# Patient Record
Sex: Female | Born: 1960 | Race: White | Hispanic: No | Marital: Single
Health system: Midwestern US, Community
[De-identification: ages and names within clinical notes are randomized; demographics above are authoritative.]

## PROBLEM LIST (undated history)

## (undated) DIAGNOSIS — C50911 Malignant neoplasm of unspecified site of right female breast: Secondary | ICD-10-CM

---

## 2013-12-05 ENCOUNTER — Ambulatory Visit: Admit: 2013-12-05 | Payer: BLUE CROSS/BLUE SHIELD | Attending: Surgery | Primary: Family Medicine

## 2013-12-05 DIAGNOSIS — C50911 Malignant neoplasm of unspecified site of right female breast: Secondary | ICD-10-CM

## 2013-12-06 NOTE — Progress Notes (Signed)
Dr. Ilene Qua, MD, FACS  [address]  [place] [address]      Patient: Candice Carter     MRN: [id number]     DOB: Jul 22, 1960     Visit Type: Follow up    Date: 12/06/2013      Time: 12:40 PM     Referring Practitioner: Clent Jacks, MD     Primary Care Provider: Phys Other, MD     Chief Complaint  Chief Complaint   Patient presents with   ??? Breast Cancer     right        History Of Present Illness:   Pt had a routine mammogram in August which showed a new nodule in the right breast. She had additional views and and ultrasound which showed a 7 mm suspicious mass in the right. She denies feeling any breast masses, skin changes or nipple discharge. She had an ultrasound guided core biopsy 9/4 which showed an invasive ductal carcinoma which is ER, PR + and HER 2 negative. She had an MRI 9/30 which did not show any bilateral or multicentric disease. She is very anxious about having another cancer and wants to have both of her breasts removed.  She came up here from [facility] to meet with Dr. Ted Mcalpine to discuss bilateral mastectomies with DIEP reconstruction. She saw a Dietitian in [facility] and had testing done but is waiting for the results.    Past Medical History  Past Medical History   Diagnosis Date   ??? Anemia    ??? Fibroids    ??? Colon cancer ([place]) 1991     had surgery and chemotherapy   ??? Hypertension         Past Surgical History  Past Surgical History   Procedure Laterality Date   ??? Hx laparotomy       lysis of adhesions   ??? Hx breast biopsy  2005     benign   ??? Hx dilation and curettage     ??? Hx heent  2011     jaw surgery   ??? Hx rhinoplasty     ??? Hx colectomy  1991     for cancer;had chemotherapy   ??? Hx hernia repair  2010     incisional hernia repair   ??? Hx gi  2011     lysis of adhesions        Family History  Family History   Problem Relation Age of Onset   ??? Breast Cancer Neg Hx    ??? Ovarian Cancer Neg Hx         Current Meds  Current Outpatient Prescriptions   Medication Sig Dispense Refill    ??? Hydrochlorothiazide (HYDRODIURIL) 12.5 mg tablet Take 12.5 mg by mouth daily.     ??? metoprolol (LOPRESSOR) 50 mg tablet Take  by mouth two (2) times a day.     ??? ibuprofen (ADVIL) 200 mg tablet Take  by mouth.          Social History  History     Social History   ??? Marital Status: SINGLE     Spouse Name: N/A     Number of Children: N/A   ??? Years of Education: N/A     Social History Main Topics   ??? Smoking status: Never Smoker    ??? Smokeless tobacco: Not on file   ??? Alcohol Use: Yes      Comment: socially   ??? Drug Use:  Not on file   ??? Sexual Activity: Not on file     Other Topics Concern   ??? Not on file     Social History Narrative       Patient Occupation  attorney    Tobacco  History   Smoking status   ??? Never Smoker    Smokeless tobacco   ??? Not on file       Alcohol  History   Alcohol Use   ??? Yes     Comment: socially          Allergies  Allergies   Allergen Reactions   ??? Levaquin [Levofloxacin] Hives     Breathing problem   ??? Premarin [Conjugated Estrogens] Hives     Breathing problem, vaginal bleeding       LMP  No LMP recorded. Patient is not currently having periods (Reason: Menopause).       Reproductive     Menarche: 28    Menopause: perimenopausal    Number of  Full Term Pregnancies: 0    Age at First Full Term Pregnancy: NA    Breastfeeding: NA    OCP Hx: took for 10 years to regulate her menses    HRT Hx: no    Ethnicity        Generation  1st generation WE American    Review of Systems:  Constitutional: has hot flashes  Ears, nose, mouth, throat, and face: wears glasses  Respiratory: chronic cough  Cardiovascular: negative  Gastrointestinal: negative  Genitourinary:positive for frequency  Integument/breast: negative  Hematologic/lymphatic: negative  Musculoskeletal:negative  Neurological: negative  Allergic/Immunologic: negative    Vitals:   Filed Vitals:    12/05/13 1625   BP: 156/86   Pulse: 59   Height: 5' 6" (1.676 m)   Weight: 213 lb (96.616 kg)         BMI:    Body mass index is 34.4 kg/(m^2).      Physical Examination  Pt is awake and alert, somewhat anxious, sitting on the exam table.    Breast  She has a well healed scar at the lateral aspect of the right areola and at the lower, inner left breast. She has large, pendulous breasts. She has no discreet masses, skin changes or nipple discharge.      Assessment/Plan  I met with the patient and her brother to discuss her treatment options. We discussed surgical options of a lumpectomy with radiation vs a mastectomy +/- reconstruction.  I explained that there is no difference in survival between these two options. The patient, however, is very nervous about getting radiation.  I explained to her that this was not chemotherapy and has minimal side effects. She understood this but since her colon cancer she feels that she wants to have both breasts removed. I discussed the technique of a sentinel node biopsy and possible axillary node dissection. She had already met with Drs. Usal and Clovis Riley. She would like nipple sparing mastectomies but is currently not a candidate because she has such large pendulous breasts.  Drs. Usal and Clovis Riley came up with a 2 stage procedure which would possible allow her to keep her nipples. The first would be a combination lumpectomy and breast reduction, with a sentinel node biopsy. This would remove the cancer, stage her nodes and be like a delayed flap for her nipples, increasing the blood flow to them in the interim while she was healing.  This would also give her time to get  the results from her genetic testing and to determine if she needs chemotherapy. We briefly discussed indications for chemotherapy including positive nodes or a high recurrence score on an oncotype dx. I gave her extensive literature about breast cancer types, surgical options, radiation and chemotherapy.  I think she wants to proceed with the lumpectomy and reduction.  I told her since the mass is not palpable, she  would need preoperative needle localization on the day of surgery. She will need medical clearance as well.  I will discuss the timing of her case with Drs. Usal and Clovis Riley.  Signature: Christia Reading, MD

## 2013-12-11 ENCOUNTER — Encounter

## 2013-12-17 ENCOUNTER — Inpatient Hospital Stay: Admit: 2013-12-17 | Payer: BLUE CROSS/BLUE SHIELD | Primary: Family Medicine

## 2013-12-17 DIAGNOSIS — C50111 Malignant neoplasm of central portion of right female breast: Secondary | ICD-10-CM

## 2013-12-18 NOTE — Other (Signed)
Patient states she was not given CHG wipes to use prior to surgery.

## 2013-12-19 ENCOUNTER — Ambulatory Visit: Admit: 2013-12-19 | Payer: BLUE CROSS/BLUE SHIELD | Primary: Family Medicine

## 2013-12-19 ENCOUNTER — Inpatient Hospital Stay
Admit: 2013-12-19 | Discharge: 2013-12-20 | Disposition: A | Discharge status: Home / Self Care | Payer: BLUE CROSS/BLUE SHIELD | Attending: Plastic Surgery | Admitting: Plastic Surgery

## 2013-12-19 DIAGNOSIS — C50911 Malignant neoplasm of unspecified site of right female breast: Secondary | ICD-10-CM

## 2013-12-19 LAB — HCG URINE, QL. - POC
Pregnancy test,QC: NEGATIVE
Pregnancy test,urine (POC): NEGATIVE

## 2013-12-19 MED ORDER — ONDANSETRON (PF) 4 MG/2 ML INJECTION
4 mg/2 mL | INTRAMUSCULAR | Status: AC
Start: 2013-12-19 — End: ?

## 2013-12-19 MED ORDER — LACTATED RINGERS IV
INTRAVENOUS | Status: DC | PRN
Start: 2013-12-19 — End: 2013-12-19
  Administered 2013-12-19 (×3): via INTRAVENOUS

## 2013-12-19 MED ORDER — CEFAZOLIN 2 G IN 100 ML 0.9% NS
2 gram/100 mL | INTRAVENOUS | Status: AC
Start: 2013-12-19 — End: ?

## 2013-12-19 MED ORDER — METHYLENE BLUE (ANTIDOTE) 1 % IJ SOLN
1 % (0 mg/mL) | INTRAVENOUS | Status: DC | PRN
Start: 2013-12-19 — End: 2013-12-19
  Administered 2013-12-19: 15:00:00 via TOPICAL

## 2013-12-19 MED ORDER — MIDAZOLAM 1 MG/ML IJ SOLN
1 mg/mL | INTRAMUSCULAR | Status: DC | PRN
Start: 2013-12-19 — End: 2013-12-19
  Administered 2013-12-19 (×2): via INTRAVENOUS

## 2013-12-19 MED ORDER — TRIMETHOBENZAMIDE 100 MG/ML IM
100 mg/mL | INTRAMUSCULAR | Status: AC
Start: 2013-12-19 — End: 2013-12-19
  Administered 2013-12-19: 19:00:00 via INTRAMUSCULAR

## 2013-12-19 MED ORDER — FENTANYL CITRATE (PF) 50 MCG/ML IJ SOLN
50 mcg/mL | INTRAMUSCULAR | Status: AC
Start: 2013-12-19 — End: ?

## 2013-12-19 MED ORDER — TRIMETHOBENZAMIDE 100 MG/ML IM
100 mg/mL | Freq: Once | INTRAMUSCULAR | Status: AC
Start: 2013-12-19 — End: 2013-12-19

## 2013-12-19 MED ORDER — BUPIVACAINE LIPOSOME (PF) 266 MG/20 ML (13.3 MG/ML) SUSP, INFILTRATION
1.3 % (13.3 mg/mL) | Status: AC
Start: 2013-12-19 — End: ?

## 2013-12-19 MED ORDER — PROPOFOL 10 MG/ML IV EMUL
10 mg/mL | INTRAVENOUS | Status: AC
Start: 2013-12-19 — End: ?

## 2013-12-19 MED ORDER — HYDROMORPHONE (PF) 2 MG/ML IJ SOLN
2 mg/mL | Freq: Once | INTRAMUSCULAR | Status: DC | PRN
Start: 2013-12-19 — End: 2013-12-19

## 2013-12-19 MED ORDER — KETOROLAC TROMETHAMINE 15 MG/ML INJECTION
15 mg/mL | Freq: Three times a day (TID) | INTRAMUSCULAR | Status: DC | PRN
Start: 2013-12-19 — End: 2013-12-20
  Administered 2013-12-20 (×2): via INTRAVENOUS

## 2013-12-19 MED ORDER — LACTATED RINGERS IV
INTRAVENOUS | Status: DC
Start: 2013-12-19 — End: 2013-12-19

## 2013-12-19 MED ORDER — SODIUM CHLORIDE 0.9 % IJ SYRG
INTRAMUSCULAR | Status: DC | PRN
Start: 2013-12-19 — End: 2013-12-19
  Administered 2013-12-19: 19:00:00 via INTRAVENOUS

## 2013-12-19 MED ORDER — ACETAMINOPHEN 1,000 MG/100 ML (10 MG/ML) IV
1000 mg/100 mL (10 mg/mL) | INTRAVENOUS | Status: DC | PRN
Start: 2013-12-19 — End: 2013-12-19
  Administered 2013-12-19: 15:00:00 via INTRAVENOUS

## 2013-12-19 MED ORDER — OXYCODONE-ACETAMINOPHEN 5 MG-325 MG TAB
5-325 mg | ORAL | Status: DC | PRN
Start: 2013-12-19 — End: 2013-12-20

## 2013-12-19 MED ORDER — PROPOFOL 10 MG/ML IV EMUL
10 mg/mL | INTRAVENOUS | Status: DC | PRN
Start: 2013-12-19 — End: 2013-12-19
  Administered 2013-12-19: 14:00:00 via INTRAVENOUS

## 2013-12-19 MED ORDER — LACTATED RINGERS BOLUS IV
Freq: Two times a day (BID) | INTRAVENOUS | Status: DC | PRN
Start: 2013-12-19 — End: 2013-12-19

## 2013-12-19 MED ORDER — DEXAMETHASONE SODIUM PHOSPHATE 4 MG/ML IJ SOLN
4 mg/mL | INTRAMUSCULAR | Status: AC
Start: 2013-12-19 — End: ?

## 2013-12-19 MED ORDER — CEFAZOLIN 1 GRAM SOLUTION FOR INJECTION
1 gram | Freq: Once | INTRAMUSCULAR | Status: AC
Start: 2013-12-19 — End: 2013-12-19
  Administered 2013-12-19 (×2): via INTRAVENOUS

## 2013-12-19 MED ORDER — SODIUM CHLORIDE 0.9 % IRRIGATION SOLN
0.9 % | Status: DC | PRN
Start: 2013-12-19 — End: 2013-12-19
  Administered 2013-12-19: 16:00:00

## 2013-12-19 MED ORDER — EPHEDRINE SULFATE 50 MG/ML IJ SOLN
50 mg/mL | INTRAMUSCULAR | Status: DC | PRN
Start: 2013-12-19 — End: 2013-12-19
  Administered 2013-12-19 (×4): via INTRAVENOUS

## 2013-12-19 MED ORDER — LIDOCAINE HCL 2 % (20 MG/ML) IJ SOLN
20 mg/mL (2 %) | Freq: Once | INTRAMUSCULAR | Status: AC
Start: 2013-12-19 — End: 2013-12-19
  Administered 2013-12-19: 13:00:00 via SUBCUTANEOUS

## 2013-12-19 MED ORDER — SUCCINYLCHOLINE CHLORIDE 20 MG/ML INJECTION
20 mg/mL | INTRAMUSCULAR | Status: DC | PRN
Start: 2013-12-19 — End: 2013-12-19
  Administered 2013-12-19: 14:00:00 via INTRAVENOUS

## 2013-12-19 MED ORDER — ISOSULFAN BLUE 1 % IJ SOLN
1 % | SUBCUTANEOUS | Status: DC | PRN
Start: 2013-12-19 — End: 2013-12-19
  Administered 2013-12-19: 14:00:00 via SUBCUTANEOUS

## 2013-12-19 MED ORDER — NEOSTIGMINE METHYLSULFATE 0.5 MG/ML INJECTION
0.5 mg/mL | INTRAMUSCULAR | Status: DC | PRN
Start: 2013-12-19 — End: 2013-12-19
  Administered 2013-12-19: 18:00:00 via INTRAVENOUS

## 2013-12-19 MED ORDER — FENTANYL CITRATE (PF) 50 MCG/ML IJ SOLN
50 mcg/mL | INTRAMUSCULAR | Status: DC | PRN
Start: 2013-12-19 — End: 2013-12-19
  Administered 2013-12-19: 21:00:00 via INTRAVENOUS

## 2013-12-19 MED ORDER — SODIUM CHLORIDE 0.9 % IV
5 mg/mL | Freq: Three times a day (TID) | INTRAVENOUS | Status: DC | PRN
Start: 2013-12-19 — End: 2013-12-19

## 2013-12-19 MED ORDER — ALBUTEROL SULFATE 0.083 % (0.83 MG/ML) SOLN FOR INHALATION
2.5 mg /3 mL (0.083 %) | RESPIRATORY_TRACT | Status: DC | PRN
Start: 2013-12-19 — End: 2013-12-19

## 2013-12-19 MED ORDER — ROCURONIUM 10 MG/ML IV
10 mg/mL | INTRAVENOUS | Status: DC | PRN
Start: 2013-12-19 — End: 2013-12-19
  Administered 2013-12-19 (×4): via INTRAVENOUS

## 2013-12-19 MED ORDER — SODIUM CHLORIDE 0.9 % IV PIGGY BACK
1 gram | Freq: Three times a day (TID) | INTRAVENOUS | Status: DC
Start: 2013-12-19 — End: 2013-12-20
  Administered 2013-12-20 (×3): via INTRAVENOUS

## 2013-12-19 MED ORDER — LACTATED RINGERS IV
INTRAVENOUS | Status: DC
Start: 2013-12-19 — End: 2013-12-20
  Administered 2013-12-19 – 2013-12-20 (×3): via INTRAVENOUS

## 2013-12-19 MED ORDER — MEPERIDINE (PF) 25 MG/ML INJ SOLUTION
25 mg/ml | INTRAMUSCULAR | Status: DC | PRN
Start: 2013-12-19 — End: 2013-12-19

## 2013-12-19 MED ORDER — NEOSTIGMINE METHYLSULFATE 1 MG/ML INJECTION
1 mg/mL | INTRAMUSCULAR | Status: AC
Start: 2013-12-19 — End: ?

## 2013-12-19 MED ORDER — GLYCOPYRROLATE 0.2 MG/ML IJ SOLN
0.2 mg/mL | INTRAMUSCULAR | Status: AC
Start: 2013-12-19 — End: ?

## 2013-12-19 MED ORDER — METOCLOPRAMIDE 5 MG/ML IJ SOLN
5 mg/mL | Freq: Three times a day (TID) | INTRAMUSCULAR | Status: DC | PRN
Start: 2013-12-19 — End: 2013-12-20
  Administered 2013-12-19: 22:00:00 via INTRAVENOUS

## 2013-12-19 MED ORDER — LIDOCAINE HCL 2 % (20 MG/ML) IJ SOLN
20 mg/mL (2 %) | INTRAMUSCULAR | Status: AC
Start: 2013-12-19 — End: ?

## 2013-12-19 MED ORDER — RACEPINEPHRINE 2.25 % NEB SOLUTION
2.25 % | RESPIRATORY_TRACT | Status: DC | PRN
Start: 2013-12-19 — End: 2013-12-19

## 2013-12-19 MED ORDER — ISOSULFAN BLUE 1 % IJ SOLN
1 % | SUBCUTANEOUS | Status: AC
Start: 2013-12-19 — End: ?

## 2013-12-19 MED ORDER — ACETAMINOPHEN 1,000 MG/100 ML (10 MG/ML) IV
1000 mg/100 mL (10 mg/mL) | Freq: Three times a day (TID) | INTRAVENOUS | Status: AC | PRN
Start: 2013-12-19 — End: 2013-12-20
  Administered 2013-12-19 – 2013-12-20 (×2): via INTRAVENOUS

## 2013-12-19 MED ORDER — CEFAZOLIN 1 GRAM SOLUTION FOR INJECTION
1 gram | INTRAMUSCULAR | Status: AC
Start: 2013-12-19 — End: ?

## 2013-12-19 MED ORDER — SUCCINYLCHOLINE CHLORIDE 20 MG/ML INJECTION
20 mg/mL | INTRAMUSCULAR | Status: AC
Start: 2013-12-19 — End: ?

## 2013-12-19 MED ORDER — DEXAMETHASONE SODIUM PHOSPHATE 4 MG/ML IJ SOLN
4 mg/mL | INTRAMUSCULAR | Status: DC | PRN
Start: 2013-12-19 — End: 2013-12-19
  Administered 2013-12-19: 14:00:00 via INTRAVENOUS

## 2013-12-19 MED ORDER — ROCURONIUM 10 MG/ML IV
10 mg/mL | INTRAVENOUS | Status: AC
Start: 2013-12-19 — End: ?

## 2013-12-19 MED ORDER — ACETAMINOPHEN 1,000 MG/100 ML (10 MG/ML) IV
1000 mg/100 mL (10 mg/mL) | INTRAVENOUS | Status: AC
Start: 2013-12-19 — End: ?

## 2013-12-19 MED ORDER — HEPARIN (PORCINE) 5,000 UNIT/ML IJ SOLN
5000 unit/mL | INTRAMUSCULAR | Status: AC
Start: 2013-12-19 — End: ?

## 2013-12-19 MED ORDER — KETOROLAC TROMETHAMINE 30 MG/ML INJECTION
30 mg/mL (1 mL) | INTRAMUSCULAR | Status: DC
Start: 2013-12-19 — End: 2013-12-19

## 2013-12-19 MED ORDER — CEFAZOLIN 2 G IN 100 ML 0.9% NS
2 gram/100 mL | Freq: Once | INTRAVENOUS | Status: DC
Start: 2013-12-19 — End: 2013-12-19

## 2013-12-19 MED ORDER — GLYCOPYRROLATE 0.2 MG/ML IJ SOLN
0.2 mg/mL | INTRAMUSCULAR | Status: DC | PRN
Start: 2013-12-19 — End: 2013-12-19
  Administered 2013-12-19: 18:00:00 via INTRAVENOUS

## 2013-12-19 MED ORDER — LIDOCAINE (PF) 20 MG/ML (2 %) IJ SOLN
20 mg/mL (2 %) | INTRAMUSCULAR | Status: DC | PRN
Start: 2013-12-19 — End: 2013-12-19
  Administered 2013-12-19: 14:00:00 via INTRAVENOUS

## 2013-12-19 MED ORDER — SODIUM BICARBONATE 4 % IV
4 % | Freq: Once | INTRAVENOUS | Status: AC
Start: 2013-12-19 — End: 2013-12-19
  Administered 2013-12-19: 13:00:00 via SUBCUTANEOUS

## 2013-12-19 MED ORDER — MIDAZOLAM 1 MG/ML IJ SOLN
1 mg/mL | INTRAMUSCULAR | Status: AC
Start: 2013-12-19 — End: ?

## 2013-12-19 MED ORDER — BACITRACIN 50,000 UNIT IM
50000 unit | INTRAMUSCULAR | Status: AC
Start: 2013-12-19 — End: ?

## 2013-12-19 MED ORDER — FENTANYL CITRATE (PF) 50 MCG/ML IJ SOLN
50 mcg/mL | INTRAMUSCULAR | Status: DC | PRN
Start: 2013-12-19 — End: 2013-12-19
  Administered 2013-12-19 (×10): via INTRAVENOUS

## 2013-12-19 MED ORDER — SODIUM CHLORIDE 0.9 % IJ SYRG
Freq: Three times a day (TID) | INTRAMUSCULAR | Status: DC
Start: 2013-12-19 — End: 2013-12-19

## 2013-12-19 MED ORDER — HEPARIN (PORCINE) 5,000 UNIT/ML IJ SOLN
5000 unit/mL | Freq: Three times a day (TID) | INTRAMUSCULAR | Status: DC
Start: 2013-12-19 — End: 2013-12-20
  Administered 2013-12-20 (×3): via SUBCUTANEOUS

## 2013-12-19 MED ORDER — SODIUM CHLORIDE 0.9 % IV
INTRAVENOUS | Status: DC | PRN
Start: 2013-12-19 — End: 2013-12-19
  Administered 2013-12-19 (×2): via INTRAVENOUS

## 2013-12-19 MED ORDER — METHYLENE BLUE (ANTIDOTE) 1 % IJ SOLN
1 % (0 mg/mL) | INTRAVENOUS | Status: AC
Start: 2013-12-19 — End: ?

## 2013-12-19 MED FILL — TIGAN 100 MG/ML INTRAMUSCULAR SOLUTION: 100 mg/mL | INTRAMUSCULAR | Qty: 2

## 2013-12-19 MED FILL — LACTATED RINGERS IV: INTRAVENOUS | Qty: 1000

## 2013-12-19 MED FILL — EXPAREL (PF) 1.3 % (13.3 MG/ML) SUSPENSION FOR LOCAL INFILTRATION: 1.3 % (13.3 mg/mL) | Qty: 20

## 2013-12-19 MED FILL — METHYLENE BLUE (ANTIDOTE) 1 % IJ SOLN: 1 % (0 mg/mL) | INTRAVENOUS | Qty: 10

## 2013-12-19 MED FILL — ROCURONIUM 10 MG/ML IV: 10 mg/mL | INTRAVENOUS | Qty: 5

## 2013-12-19 MED FILL — BD POSIFLUSH NORMAL SALINE 0.9 % INJECTION SYRINGE: INTRAMUSCULAR | Qty: 10

## 2013-12-19 MED FILL — MIDAZOLAM 1 MG/ML IJ SOLN: 1 mg/mL | INTRAMUSCULAR | Qty: 2

## 2013-12-19 MED FILL — HEPARIN (PORCINE) 5,000 UNIT/ML IJ SOLN: 5000 unit/mL | INTRAMUSCULAR | Qty: 1

## 2013-12-19 MED FILL — BACITRACIN 50,000 UNIT IM: 50000 unit | INTRAMUSCULAR | Qty: 50000

## 2013-12-19 MED FILL — ONDANSETRON (PF) 4 MG/2 ML INJECTION: 4 mg/2 mL | INTRAMUSCULAR | Qty: 2

## 2013-12-19 MED FILL — METOCLOPRAMIDE 5 MG/ML IJ SOLN: 5 mg/mL | INTRAMUSCULAR | Qty: 2

## 2013-12-19 MED FILL — GLYCOPYRROLATE 0.2 MG/ML IJ SOLN: 0.2 mg/mL | INTRAMUSCULAR | Qty: 3

## 2013-12-19 MED FILL — FENTANYL CITRATE (PF) 50 MCG/ML IJ SOLN: 50 mcg/mL | INTRAMUSCULAR | Qty: 2

## 2013-12-19 MED FILL — DEXAMETHASONE SODIUM PHOSPHATE 4 MG/ML IJ SOLN: 4 mg/mL | INTRAMUSCULAR | Qty: 1

## 2013-12-19 MED FILL — CEFAZOLIN 1 GRAM SOLUTION FOR INJECTION: 1 gram | INTRAMUSCULAR | Qty: 2000

## 2013-12-19 MED FILL — CEFAZOLIN 2 G IN 100 ML 0.9% NS: 2 gram/100 mL | INTRAVENOUS | Qty: 100

## 2013-12-19 MED FILL — QUELICIN 20 MG/ML INJECTION SOLUTION: 20 mg/mL | INTRAMUSCULAR | Qty: 10

## 2013-12-19 MED FILL — OFIRMEV 1,000 MG/100 ML (10 MG/ML) INTRAVENOUS SOLUTION: 1000 mg/100 mL (10 mg/mL) | INTRAVENOUS | Qty: 100

## 2013-12-19 MED FILL — LIDOCAINE HCL 2 % (20 MG/ML) IJ SOLN: 20 mg/mL (2 %) | INTRAMUSCULAR | Qty: 20

## 2013-12-19 MED FILL — FENTANYL CITRATE (PF) 50 MCG/ML IJ SOLN: 50 mcg/mL | INTRAMUSCULAR | Qty: 5

## 2013-12-19 MED FILL — NEOSTIGMINE METHYLSULFATE 1 MG/ML INJECTION: 1 mg/mL | INTRAMUSCULAR | Qty: 10

## 2013-12-19 MED FILL — ISOSULFAN BLUE 1 % IJ SOLN: 1 % | SUBCUTANEOUS | Qty: 5

## 2013-12-19 MED FILL — LACTATED RINGERS IV: INTRAVENOUS | Qty: 500

## 2013-12-19 MED FILL — PROPOFOL 10 MG/ML IV EMUL: 10 mg/mL | INTRAVENOUS | Qty: 20

## 2013-12-19 NOTE — Interval H&P Note (Signed)
H&P Update:  Candice Carter was seen and examined.  History and physical has been reviewed. There have been no significant clinical changes since the completion of the originally dated History and Physical.  Patient identified by surgeon; surgical site was confirmed by patient and surgeon.    Signed By: Christia Reading, MD     December 19, 2013 9:18 AM

## 2013-12-19 NOTE — Op Note (Signed)
[facility]  OPERATIVE REPORT                                            Name: Candice Carter, Candice Carter                                            MR#: [id number]                                            Account #: [id number]                                            Date of Adm: 12/19/2013        Facility [place] [id number]  ChartScript-[id number]    DATE OF SURGERY: 12/19/2013    SURGEONS:  1. Tilden Dome, MD.  2. Clent Jacks, MD.  3. Orlin Hilding, MD.    PREOPERATIVE DIAGNOSIS: Right breast cancer.    POSTOPERATIVE DIAGNOSIS: Right breast cancer.    PROCEDURE PERFORMED  1. Right lumpectomy.  2. Sentinel node biopsy.  3. Bilateral breast reduction.    ANESTHESIOLOGIST:    ASSISTANT: Clayborn Heron, PA    ANESTHESIA: General with endotracheal tube.    PROCEDURE IN DETAIL: The patient is a 53 year old female  who was recently diagnosed with a small right breast cancer. She  wanted to have both breasts removed and have a DIEP flap  reconstruction done; however, because of the size of her breasts, it  was felt to be better to do it in a staged fashion. Therefore, on the  day of surgery, she was taken to Radiology and had a needle  placed in the right breast at the site of the clip from the previous  biopsy. She was then taken to the preop holding area and Dr. Ted Mcalpine  and Dr. Clovis Riley marked her with reduction scars, carefully  measuring out the appropriate sites. She was then brought into the  operating room, given 2 grams of IV Ancef, IV sedation and then  general anesthesia via endotracheal tube. Both breasts were  prepped and draped in the usual sterile manner carefully to avoid  dislodging the needle on the right. Lymphazurin 5 mL was injected  behind the nipple-areolar complex on the right and the breast was  massaged for approximately 15 minutes. An incision was made in  the anterior axillary line and there was a blue lymphatic leading   down to what looked like 2 or 3 blue nodes. These were dissected  sharply, clipped, excised and sent to Pathology. At this point, Dr.  Clovis Riley and Dr. Ted Mcalpine came in and started their breast reduction and  made an incision around the site on the right side where the needle  entered the breast. They then moved to the left side and I  completed the lumpectomy, excising all the tissue around the distal  aspect of the needle and the wire. The lymph nodes came back  negative for metastatic carcinoma. I oriented the lumpectomy  specimen, placed it in the  specimen radiograph machine which  showed the clip from the previous biopsy and then sent this for  pathology. The axillary wound was then made hemostatic using  Bovie electrocautery and closed in 2 layers, a deep layer of 3-0  Vicryl sutures in the dermis in an interrupted fashion. Skin was  closed using a 4-0 Monocryl subcuticular stitch. At this time, Dr.  Ted Mcalpine and Dr. Clovis Riley continued with a reduction mammoplasty,  which will be dictated in a separate note.        Tilden Dome MD    KK / [place]  D:  12/19/2013   18:32  T:  12/19/2013   19:44  Job #:  [id number]              wm    CS Doc#:  [id number]                                  Page 1 of 2

## 2013-12-19 NOTE — Other (Signed)
TRANSFER - OUT REPORT:    Verbal report given to [place] , RN (non Laquida Cotrell  being transferred to 419B(unit) for routine post - op       Report consisted of patient???s Situation, Background, Assessment and   Recommendations(SBAR).     Information from the following report(s) Kardex, OR Summary, Procedure Summary, Intake/Output and MAR was reviewed with the receiving nurse.    Opportunity for questions and clarification was provided.      Patient transported with:   O2 @ 3 liters  Tech

## 2013-12-19 NOTE — Progress Notes (Signed)
Radiology Procedure Note    12/19/2013    Indications: This is a 53 y.o. female who presents with previously biopsied mass in the R breast known to be malignant.  Scheduled for needle localization.    Procedure(s): Needle localization with Homer 7.5 cm. Needle performed via a superior--> inferior approach to achieve shortest distance to MIBB clip.  No cx.    Preliminary Findings (full report to follow in PACS).    Complications: None    Signed By: Agapito Games, DO                       9:57 AM

## 2013-12-19 NOTE — Other (Signed)
Bedside shift change report given to D. Leveque, RN (oncoming nurse) by Holly Hesse, RN (offgoing nurse). Report included the following information SBAR, Intake/Output, MAR and Recent Results.

## 2013-12-19 NOTE — Progress Notes (Signed)
Pt toleratred procedure well; NAD noted; DSD and plastic cup applied over needlemarker insertion site-no bleeding noted; pt trasnferred to OR holding area via wheelchair in stable condition; SBAR report given  to OR RN included procedure summary.

## 2013-12-19 NOTE — Anesthesia Pre-Procedure Evaluation (Addendum)
Anesthetic History               Review of Systems / Medical History  Patient summary reviewed, nursing notes reviewed and pertinent labs reviewed    Pulmonary                   Neuro/Psych              Cardiovascular    Hypertension: well controlled              Exercise tolerance: >4 METS     GI/Hepatic/Renal                Endo/Other        Obesity, cancer and anemia     Other Findings   Comments: H/o right breast ca  H/o colon ca s/p colectomy    Spinal stenosis- rght middle finger numbness and tingling             Physical Exam    Airway  Mallampati: II  TM Distance: 4 - 6 cm  Neck ROM: normal range of motion   Mouth opening: Diminished (comment)     Cardiovascular  Regular rate and rhythm,  S1 and S2 normal,  no murmur, click, rub, or gallop             Dental    Dentition: Caps/crowns     Pulmonary  Breath sounds clear to auscultation               Abdominal  GI exam deferred       Other Findings            Anesthetic Plan    ASA: 3  Anesthesia type: general          Induction: Intravenous  Anesthetic plan and risks discussed with: Patient

## 2013-12-19 NOTE — Other (Signed)
1800- Pt arrived to Unit from PACU, lethargic and vomiting. VSS, Afeb, RN will continue to monitor.

## 2013-12-19 NOTE — Anesthesia Post-Procedure Evaluation (Signed)
Post-Anesthesia Evaluation and Assessment    Patient: Candice Carter MRN: [id number]  SSN: [id number]    Date of Birth: [birth year]  Age: 53 y.o.  Sex: female       Cardiovascular Function/Vital Signs  Visit Vitals   Item Reading   ??? BP 118/70 mmHg   ??? Pulse 88   ??? Temp 37.3 ??C (99.2 ??F)   ??? Resp 10   ??? Ht 5' 6.5" (1.689 m)   ??? Wt 98.657 kg (217 lb 8 oz)   ??? BMI 34.58 kg/m2   ??? SpO2 100%       Patient is status post general anesthesia for Procedure(s):  RIGHT BREAST LUMPECTOMY WITH REGULAR NEEDLE MARKER SENTINEL NODE BIOPSY POSSIBLE  AXILLARY NODE DISSECTION  * HOMER NEEDLE SPECIMEN RADIOGRAPH MACHINE NEEDLE MARKER TO BE DONE 8AM  [place] FMLR PER DR KARSIF DR BOLTON APPROVED 8AM 1 HR PRIOR TO SURGERY FOR MARKER  BREAST REDUCTION BILATERAL.    Nausea/Vomiting: None    Postoperative hydration reviewed and adequate.    Pain:  Pain Scale 1: Numeric (0 - 10) (12/19/13 0854)  Pain Intensity 1: 0 (12/19/13 0854)   Managed    Neurological Status:   Neuro (WDL): Within Defined Limits (12/19/13 0712)   At baseline    Mental Status and Level of Consciousness: Alert and oriented     Pulmonary Status:   O2 Device: Nasal cannula (12/19/13 1434)   Adequate oxygenation and airway patent    Complications related to anesthesia: None    Post-anesthesia assessment completed. No concerns    Signed By: Angus Seller, MD     December 19, 2013

## 2013-12-19 NOTE — Op Note (Signed)
[facility]  OPERATIVE REPORT                                            Name: Candice Carter, Candice Carter                                            MR#: [id number]                                            Account #: [id number]                                            Date of Adm: 12/19/2013        Facility [place] [id number]  ChartScript-[id number]    DATE OF SURGERY: 12/19/2013    SURGEON: Clent Jacks, MD    ASSISTANT SURGEON: Orlin Hilding, MD    PREOPERATIVE DIAGNOSIS: Right breast cancer.    POSTOPERATIVE DIAGNOSES:  1. Status post needle localization and lumpectomy and sentinel  lymph node dissection, right breast.  2. Right breast reduction for oncoplastic purposes.  3. Left breast reduction for symmetry purposes.    PROCEDURE PERFORMED  :1. Right breast oncoplastic breast preservation surgery breast reduction  2. Left breast reduction for symmetry purposes  ANESTHESIOLOGIST:    ANESTHESIA: General.    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 She was diagnosed with breast cancer on the  right breast upper medial aspect. She came to [place] for a  second opinion. She was seen in my office in [place] and  consulted for autologous versus implant reconstruction because she  wanted bilateral mastectomies. However, she wants to keep her  nipple. At this stage, she has bilateral large double D breasts and to  be able to preserve her nipples we recommended her bilateral  breast reduction following lumpectomy and raise her nipples, and  decrease the amount of skin envelope, and then she can be  scheduled for autologous breast reconstruction following  mastectomies. She agreed to this plan. In this staged procedure, she  agrees to bilateral breast reduction. On the right side breast  reduction will be done for oncoplastic purposes, on the left side it  will be done for symmetry purposes. The patient has bilateral   double D breasts and benefits and risks of this surgery were  explained to her in detail, such as infection, bleeding,  hematoma, DVT, pulmonary embolism, loss of nipple-areolar  complex, partial loss of nipple-areolar complex, nonhealing  wounds, wound dehiscence, delayed wound healing, and loss of  sensation around the nipples, and sensory changes on the operated  areas, fat necrosis, and revision surgeries were explained in detail.  Informed consent was obtained.    DESCRIPTION OF PROCEDURE: In the preoperative holding  area today, she was again marked and the incisions were shown,  and procedure was discussed with her by myself, Dr. Samul Dada, and  also Dr. Orlin Hilding. The patient was brought to the operating  room and placed on the table in  a supine position. After the  induction and intubation, a Foley catheter was placed into the  bladder. She was also given 5000 units of subcutaneous heparin  for DVT prophylaxis and IV 2 grams Ancef was given for wound  prophylaxis. Then Dr. Samul Dada started with the sentinel lymph node  dissection. Once this was completed, we were called into the  operating room and starting on the right side we planned an  inferior pedicle technique leaving 10 cm pedicle width and tracing  the nipple to transfer 4 cm from the sternal notch. An incision was  made in a Wise pattern incision and above the nipple a keyhole  incision was made where the needle localization was. Around the  needle localization, the skin and subcutaneous tissues were  dissected above the nipple on the superior pole of the breast and  the needle localized soft tissue was isolated. Once this was done,  Dr. Samul Dada did a lumpectomy. Once this was completed, the  procedure was completed as an inferior pedicle breast reduction to  move the nipple to the desired position and internally to close the  defect from the lumpectomy. So inferior pedicle technique was   used and 10 cm of pedicle was deepithelialized in the inferior  portion. Then lateral and medial incisions were made on the skin.  Dissection was carried out with Bovie diathermy. Skin flaps were  raised and excess breast tissue was divided both laterally and  medially and the nipple was mobilized on the inferior pedicle.  Once this was done, the wound was irrigated with antibiotic  irrigation solution and the wounds were closed temporarily with  skin staples. Removed tissue from the right side weight was 702  grams.    Then, on the left side, again with the inferior pedicle technique and  Wise pattern incision, incisions were made around the periareolar  area and inferior pedicle. The area was deepithelialized and then  the skin incisions were made with #10 blade, and with Bovie  diathermy medially and laterally superior skin flaps were elevated  superiorly to the infraclavicular area, laterally down to the axillary  line, medially towards the sternal edges. The flaps were elevated.  Then excess breast tissue was excised from the superior, lateral,  and medial portions. Total removed tissue weighed 897 grams, as  the left breast was the larger breast more tissue was removed from  this side. The wounds were irrigated with antibiotic irrigation  solution. Then the temporary skin envelopes were closed with skin  staples. The patient was brought into a sitting position and  symmetry was observed. So the patient was put in a supine  position again and the wounds were closed with 2-0 Vicryl sutures  in the subcutaneous tissues, 3-0 Monocryl sutures in subcuticular  continuous sutures on both sides, and Dermabond, Telfa, ABDs  and surgical bra dressings were applied. At the end of the  procedure, both nipple-areolar complexes had good capillary refill  and were warm. The patient was sent to the recovery room in  satisfactory condition. The patient will be kept in the hospital for   23-hour observation, postoperative pain control, and IV antibiotics.        Clent Jacks MD    HU / CR  D:  12/19/2013   14:34  T:  12/19/2013   15:24  Job #:  [id number]              wm    CS Doc#:  [id number]  Page 1 of 3

## 2013-12-19 NOTE — H&P (Signed)
See note from 10/10.

## 2013-12-19 NOTE — Op Note (Signed)
[facility]  OPERATIVE REPORT                                            Name: Candice Carter, Candice Carter                                            MR#: [id number]                                            Account #: [id number]                                            Date of Adm: 12/19/2013        Facility [place] [id number]  ChartScript-[id number]    DATE OF SURGERY: 12/19/2013    SURGEON: Clent Jacks, MD    ASSISTANT SURGEON: Orlin Hilding, MD    PREOPERATIVE DIAGNOSIS: Right breast cancer.    POSTOPERATIVE DIAGNOSES:  1. Status post needle localization and lumpectomy and sentinel  lymph node dissection, right breast.  2. Right breast reduction for oncoplastic purposes.  3. Left breast reduction for symmetry purposes.    PROCEDURE PERFORMED  :1. Right breast oncoplastic breast preservation surgery breast reduction  2. Left breast reduction for symmetry purposes  ANESTHESIOLOGIST:    ANESTHESIA: General.    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 She was diagnosed with breast cancer on the  right breast upper medial aspect. She came to [place] for a  second opinion. She was seen in my office in [place] and  consulted for autologous versus implant reconstruction because she  wanted bilateral mastectomies. However, she wants to keep her  nipple. At this stage, she has bilateral large double D breasts and to  be able to preserve her nipples we recommended her bilateral  breast reduction following lumpectomy and raise her nipples, and  decrease the amount of skin envelope, and then she can be  scheduled for autologous breast reconstruction following  mastectomies. She agreed to this plan. In this staged procedure, she  agrees to bilateral breast reduction. On the right side breast  reduction will be done for oncoplastic purposes, on the left side it  will be done for symmetry purposes. The patient has bilateral  double D breasts and  benefits and risks of this surgery were  explained to her in detail, such as infection, bleeding,  hematoma, DVT, pulmonary embolism, loss of nipple-areolar  complex, partial loss of nipple-areolar complex, nonhealing  wounds, wound dehiscence, delayed wound healing, and loss of  sensation around the nipples, and sensory changes on the operated  areas, fat necrosis, and revision surgeries were explained in detail.  Informed consent was obtained.    DESCRIPTION OF PROCEDURE: In the preoperative holding  area today, she was again marked and the incisions were shown,  and procedure was discussed with her by myself, Dr. Samul Dada, and  also Dr. Orlin Hilding. The patient was brought to the operating  room and placed on the table in  a supine position. After the  induction and intubation, a Foley catheter was placed into the  bladder. She was also given 5000 units of subcutaneous heparin  for DVT prophylaxis and IV 2 grams Ancef was given for wound  prophylaxis. Then Dr. Samul Dada started with the sentinel lymph node  dissection. Once this was completed, we were called into the  operating room and starting on the right side we planned an  inferior pedicle technique leaving 10 cm pedicle width and tracing  the nipple to transfer 4 cm from the sternal notch. An incision was  made in a Wise pattern incision and above the nipple a keyhole  incision was made where the needle localization was. Around the  needle localization, the skin and subcutaneous tissues were  dissected above the nipple on the superior pole of the breast and  the needle localized soft tissue was isolated. Once this was done,  Dr. Samul Dada did a lumpectomy. Once this was completed, the  procedure was completed as an inferior pedicle breast reduction to  move the nipple to the desired position and internally to close the  defect from the lumpectomy. So inferior pedicle technique was  used and 10 cm of pedicle was deepithelialized in the inferior  portion. Then  lateral and medial incisions were made on the skin.  Dissection was carried out with Bovie diathermy. Skin flaps were  raised and excess breast tissue was divided both laterally and  medially and the nipple was mobilized on the inferior pedicle.  Once this was done, the wound was irrigated with antibiotic  irrigation solution and the wounds were closed temporarily with  skin staples. Removed tissue from the right side weight was 702  grams.    Then, on the left side, again with the inferior pedicle technique and  Wise pattern incision, incisions were made around the periareolar  area and inferior pedicle. The area was deepithelialized and then  the skin incisions were made with #10 blade, and with Bovie  diathermy medially and laterally superior skin flaps were elevated  superiorly to the infraclavicular area, laterally down to the axillary  line, medially towards the sternal edges. The flaps were elevated.  Then excess breast tissue was excised from the superior, lateral,  and medial portions. Total removed tissue weighed 897 grams, as  the left breast was the larger breast more tissue was removed from  this side. The wounds were irrigated with antibiotic irrigation  solution. Then the temporary skin envelopes were closed with skin  staples. The patient was brought into a sitting position and  symmetry was observed. So the patient was put in a supine  position again and the wounds were closed with 2-0 Vicryl sutures  in the subcutaneous tissues, 3-0 Monocryl sutures in subcuticular  continuous sutures on both sides, and Dermabond, Telfa, ABDs  and surgical bra dressings were applied. At the end of the  procedure, both nipple-areolar complexes had good capillary refill  and were warm. The patient was sent to the recovery room in  satisfactory condition. The patient will be kept in the hospital for  23-hour observation, postoperative pain control, and IV antibiotics.        Clent Jacks MD    [facility] / CR  D:  12/19/2013    14:34  T:  12/19/2013   15:24  Job #:  [id number]              wm    CS Doc#:  [id number]  Page 1 of 3

## 2013-12-19 NOTE — Op Note (Signed)
[facility]  OPERATIVE REPORT                                            Name: Candice Carter, Candice Carter                                            MR#: [id number]                                            Account #: [id number]                                            Date of Adm: 12/19/2013        Facility [place] [id number]  ChartScript-[id number]    DATE OF SURGERY: 12/19/2013    SURGEONS:  1. Tilden Dome, MD.  2. Clent Jacks, MD.  3. Orlin Hilding, MD.    PREOPERATIVE DIAGNOSIS: Right breast cancer.    POSTOPERATIVE DIAGNOSIS: Right breast cancer.    PROCEDURE PERFORMED  1. Right lumpectomy.  2. Sentinel node biopsy.  3. Bilateral breast reduction.    ANESTHESIOLOGIST:    ASSISTANT: Clayborn Heron, PA    ANESTHESIA: General with endotracheal tube.    PROCEDURE IN DETAIL: The patient is a 53 year old female  who was recently diagnosed with a small right breast cancer. She  wanted to have both breasts removed and have a DIEP flap  reconstruction done; however, because of the size of her breasts, it  was felt to be better to do it in a staged fashion. Therefore, on the  day of surgery, she was taken to Radiology and had a needle  placed in the right breast at the site of the clip from the previous  biopsy. She was then taken to the preop holding area and Dr. Ted Mcalpine  and Dr. Clovis Riley marked her with reduction scars, carefully  measuring out the appropriate sites. She was then brought into the  operating room, given 2 grams of IV Ancef, IV sedation and then  general anesthesia via endotracheal tube. Both breasts were  prepped and draped in the usual sterile manner carefully to avoid  dislodging the needle on the right. Lymphazurin 5 mL was injected  behind the nipple-areolar complex on the right and the breast was  massaged for approximately 15 minutes. An incision was made in  the anterior axillary line and there was a blue lymphatic leading  down to what looked like 2 or 3  blue nodes. These were dissected  sharply, clipped, excised and sent to Pathology. At this point, Dr.  Clovis Riley and Dr. Ted Mcalpine came in and started their breast reduction and  made an incision around the site on the right side where the needle  entered the breast. They then moved to the left side and I  completed the lumpectomy, excising all the tissue around the distal  aspect of the needle and the wire. The lymph nodes came back  negative for metastatic carcinoma. I oriented the lumpectomy  specimen, placed it in the  specimen radiograph machine which  showed the clip from the previous biopsy and then sent this for  pathology. The axillary wound was then made hemostatic using  Bovie electrocautery and closed in 2 layers, a deep layer of 3-0  Vicryl sutures in the dermis in an interrupted fashion. Skin was  closed using a 4-0 Monocryl subcuticular stitch. At this time, Dr.  Ted Mcalpine and Dr. Clovis Riley continued with a reduction mammoplasty,  which will be dictated in a separate note.        Tilden Dome MD    [facility] / [facility]  D:  12/19/2013   18:32  T:  12/19/2013   19:44  Job #:  [id number]              wm    CS Doc#:  [id number]                                  Page 1 of 2

## 2013-12-20 MED ORDER — KETOROLAC TROMETHAMINE 60 MG/2 ML IM
60 mg/2 mL | Freq: Once | INTRAMUSCULAR | Status: DC
Start: 2013-12-20 — End: 2013-12-20

## 2013-12-20 MED ORDER — LOVENOX 40 MG/0.4 ML SUBCUTANEOUS SYRINGE
40 mg/0.4 mL | INJECTION | Freq: Every day | SUBCUTANEOUS | Status: AC
Start: 2013-12-20 — End: 2013-12-25

## 2013-12-20 MED FILL — CEFAZOLIN 1 GRAM SOLUTION FOR INJECTION: 1 gram | INTRAMUSCULAR | Qty: 1000

## 2013-12-20 MED FILL — OFIRMEV 1,000 MG/100 ML (10 MG/ML) INTRAVENOUS SOLUTION: 1000 mg/100 mL (10 mg/mL) | INTRAVENOUS | Qty: 100

## 2013-12-20 MED FILL — HEPARIN (PORCINE) 5,000 UNIT/ML IJ SOLN: 5000 unit/mL | INTRAMUSCULAR | Qty: 1

## 2013-12-20 MED FILL — KETOROLAC TROMETHAMINE 60 MG/2 ML IM: 60 mg/2 mL | INTRAMUSCULAR | Qty: 1

## 2013-12-20 MED FILL — KETOROLAC TROMETHAMINE 15 MG/ML INJECTION: 15 mg/mL | INTRAMUSCULAR | Qty: 1

## 2013-12-20 NOTE — Progress Notes (Signed)
S/p  Rt lumpectomy and oncoplastic breast preservation surgery bilateral BBR.  POD #1  VSS afebrile  Tolerating diet  Both nipple areola are viable, skin flaps are viable.  Plan D/c foley D/C Home today   F/U in the office   Rx for DVT prophylaxis

## 2013-12-20 NOTE — Discharge Summary (Signed)
S/P Rt lumpectomy and breast preservation oncoplastic BBR   POD #1  No major issues   D/c home with office F/U   Levanox for DVT prophylaxis

## 2013-12-20 NOTE — Progress Notes (Signed)
Patient discharged per MD order. Brother at bedside to take patient home. Foley discontinued and patient voided with no complaints or pain. VS done prior to discharge and are stable. Patient ambulated around unit and tolerated well with no signs or symptoms of distress.  Patient armband removed and shredded.    I have reviewed discharge instructions with the patient.  The patient verbalized understanding.

## 2013-12-24 MED FILL — LACTATED RINGERS IV: INTRAVENOUS | Qty: 2600

## 2013-12-24 MED FILL — OFIRMEV 1,000 MG/100 ML (10 MG/ML) INTRAVENOUS SOLUTION: 1000 mg/100 mL (10 mg/mL) | INTRAVENOUS | Qty: 100

## 2013-12-30 ENCOUNTER — Ambulatory Visit: Admit: 2013-12-30 | Payer: BLUE CROSS/BLUE SHIELD | Attending: Surgery | Primary: Family Medicine

## 2013-12-30 DIAGNOSIS — C50911 Malignant neoplasm of unspecified site of right female breast: Secondary | ICD-10-CM

## 2013-12-30 NOTE — Progress Notes (Signed)
Pt s/p bilateral reduction and right lumpectomy and sentinel node biopsy. She is feeling good, not having much pain. 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    On exam she has well healed scars bilaterally and in the right axilla.    I told her that I would send off an oncotype to help in her decision making process. She will be going back to [place] tomorrow.  I told her that she will need to be treated with a hormone blocker and will need to be seen by an oncologist.  I referred her to see Dr. Ellwood Dense if she wants to see someone in this area.  She will think about it.    I will call her back with her oncotype results.

## 2013-12-30 NOTE — Progress Notes (Signed)
Will order Oncotype dx to aid with treatment decision as per Dr. Samul Dada.

## 2013-12-31 ENCOUNTER — Encounter: Attending: Surgery | Primary: Family Medicine

## 2014-01-03 NOTE — Progress Notes (Signed)
[employer] REQUESTED HISTORY PHYSICAL, PROGRESS NOTES AND CONSULT NOTES. THEY WERE FAXED TO [employer] @ [phone]. REQUEST AS WELL AS ALL NOTES AND FAX CONFIRMATION ARE ALL SCANNED IN

## 2014-01-07 NOTE — Telephone Encounter (Signed)
Patient's Oncotype dx score came in today via email / fax. Patient lives in [place]. However I will show results to Dr. Samul Dada so she can advise. ag

## 2014-01-19 NOTE — Telephone Encounter (Signed)
Faxed her Path, Consult, Op rep & Imaging Rep to Dr. Jimmy Footman as per Dr. Starleen Arms conversation with patient who stated her Med Onc did not receive records.     Ph # [phone]  Fax # [phone]

## 2018-11-19 NOTE — Progress Notes (Signed)
Pt called me to tell me that she had a recurrence in the right breast. She is very frustrated about the doctors she is working with. She was told that she should have a mastectomy. I told her that since she never had radiation therapy I told her that she is still a candidate for a lumpectomy. I told her that she also should have a metastatic evaluation since she does have a recurrence. She understood this and will find out if she can come back here for treatment.

## 2022-01-11 IMAGING — MR MRI SHOULDER LT W/O CONTRAST
5 series · 40 of 40 positions shown · IV contrast (Off)
Comparison: None.

﻿MRI OF THE LEFT SHOULDER WITHOUT CONTRAST
CLINICAL HISTORY: Pain, fall.  Date of injury 12/27/2021.
TECHNIQUE: Multiplanar, multisequence MRI was performed on a [DATE] Tesla open MRI.

[Series 3: T2 · axial · left · 4.5mm · 0.78mm/px · z∈[-43,+28]mm · 9 of 17 slices shown (1 of 3)]
[im 1/17]
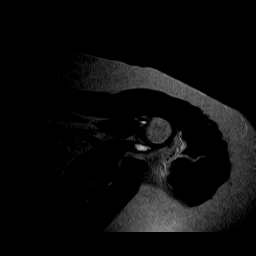
[im 3/17]
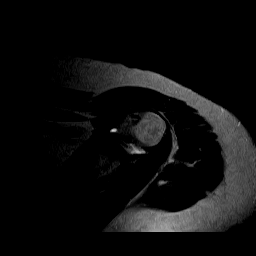
[im 5/17]
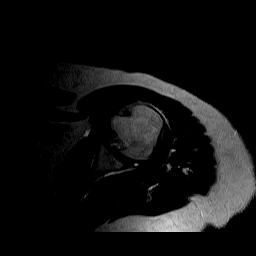
[im 7/17]
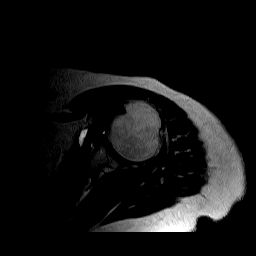
[im 9/17]
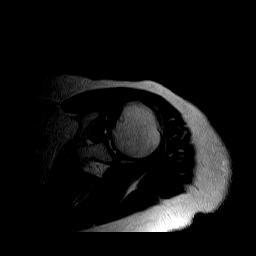
[im 11/17]
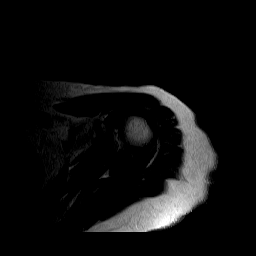
[im 13/17]
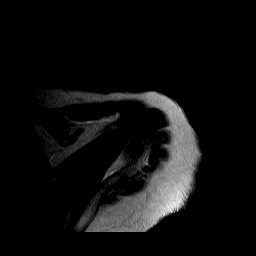
[im 15/17]
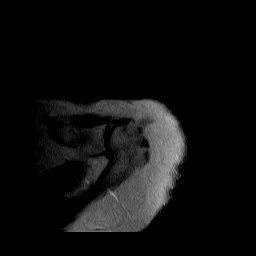
[im 17/17]
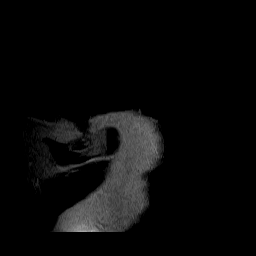

[Series 4: T2 · oblique · left · 4.5mm · 0.78mm/px · 10 of 19 slices shown (2 of 3)]
[im 1/19]
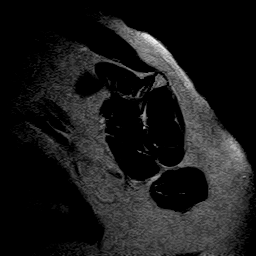
[im 3/19]
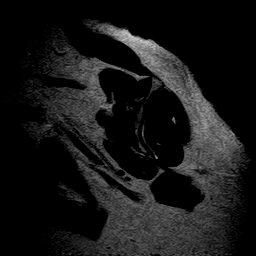
[im 5/19]
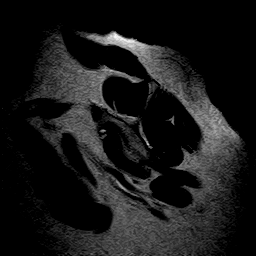
[im 7/19]
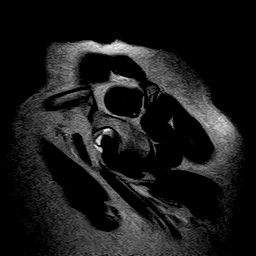
[im 9/19]
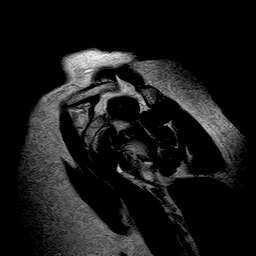
[im 11/19]
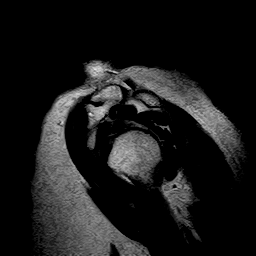
[im 13/19]
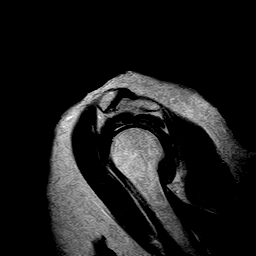
[im 15/19]
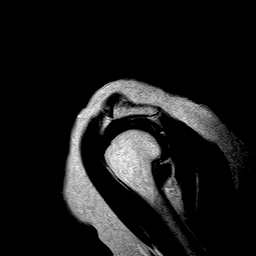
[im 17/19]
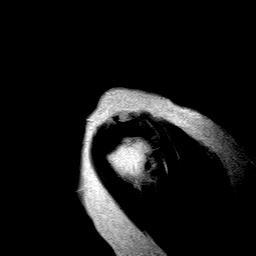
[im 19/19]
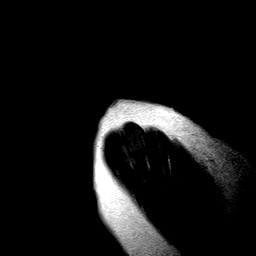

[Series 7: fat seps cor · oblique · left · 4.0mm · 0.78mm/px · 7 of 14 slices shown]
[im 1/14]
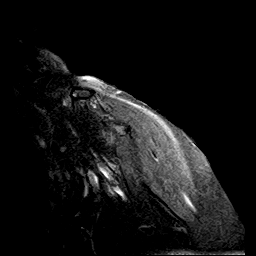
[im 3/14]
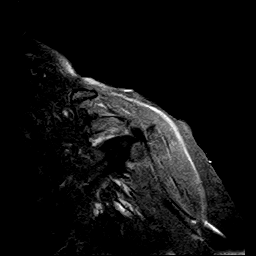
[im 5/14]
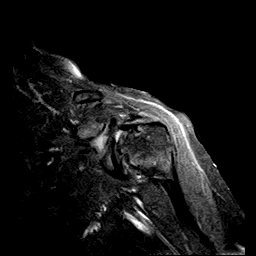
[im 7/14]
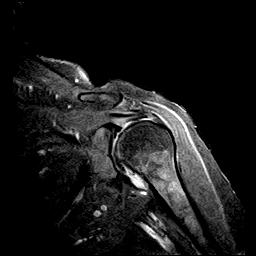
[im 9/14]
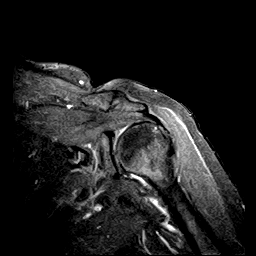
[im 11/14]
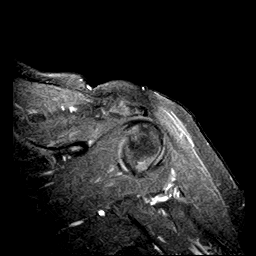
[im 14/14]
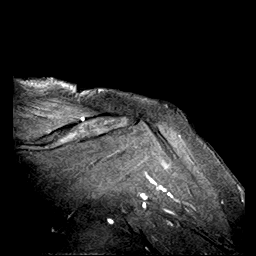

[Series 8: T1 · oblique · left · 4.0mm · 0.39mm/px · 7 of 14 slices shown]
[im 1/14]
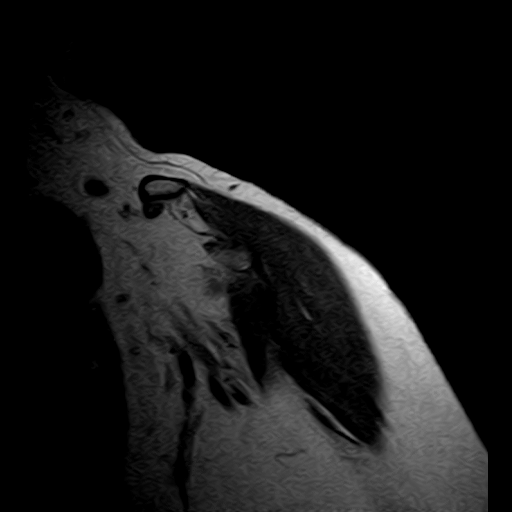
[im 3/14]
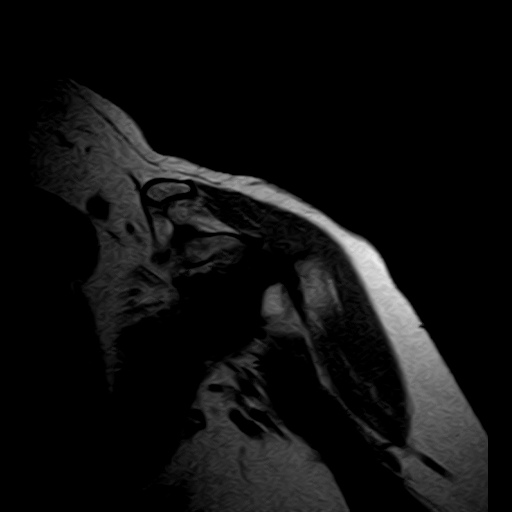
[im 5/14]
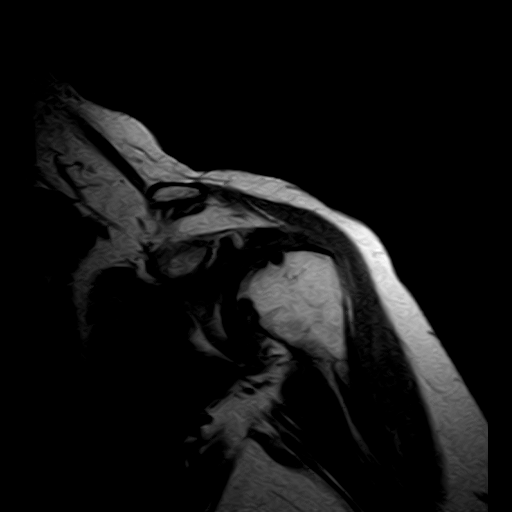
[im 7/14]
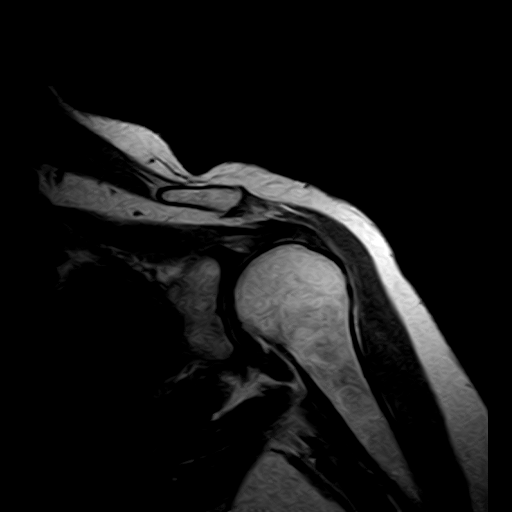
[im 9/14]
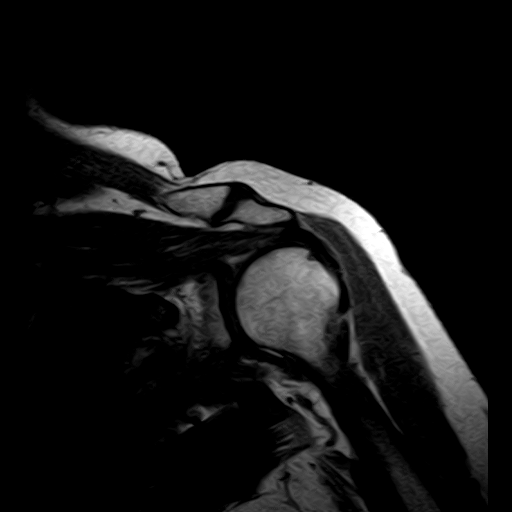
[im 11/14]
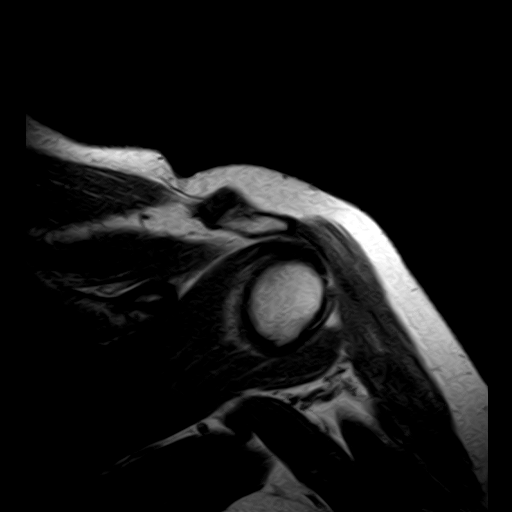
[im 14/14]
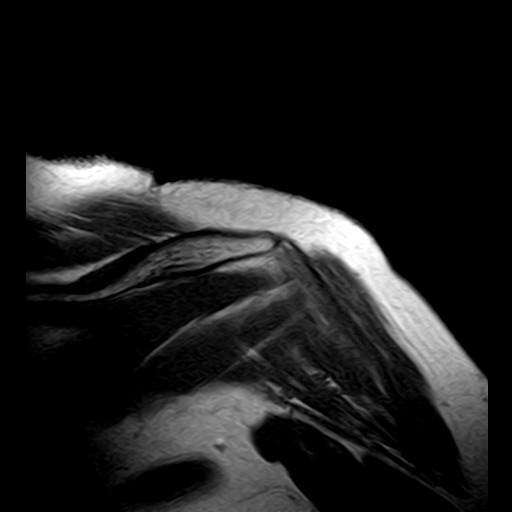

[Series 9: T2 · oblique · left · 4.0mm · 0.78mm/px · 7 of 14 slices shown (3 of 3)]
[im 1/14]
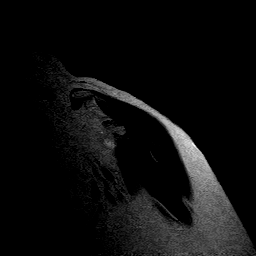
[im 3/14]
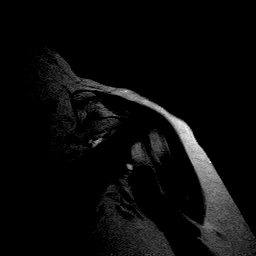
[im 5/14]
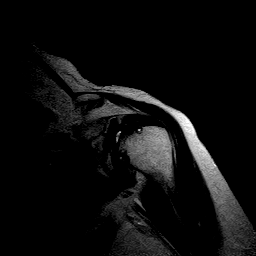
[im 7/14]
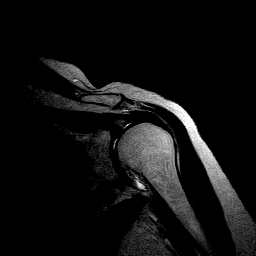
[im 9/14]
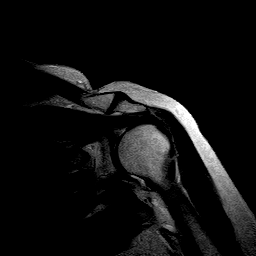
[im 11/14]
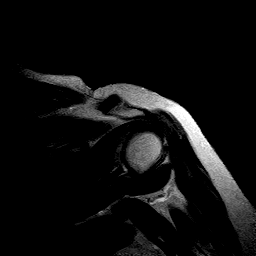
[im 14/14]
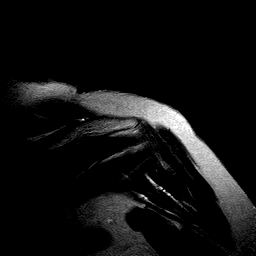

[40 of 40 positions shown; findings below may reference images not displayed]

FINDINGS: No evidence of os acromiale.  Mild fluid identified in the biceps tendon sheath.  The glenoid labrum reveals mild degenerative signal about the anterior posterior superior labrum.  

Mild subcoracoid bursal fluid identified.  

The teres minor tendon is intact.  The infraspinatus tendon is intact.  The supraspinatus tendon is intact.  The subscapularis tendon is intact.  Minimal glenohumeral joint effusion.  

Mild degenerative changes of the acromioclavicular joint with inferior osteophyte formation.
IMPRESSION: 1. Minimal glenohumeral joint effusion.  Mild subcoracoid bursal fluid identified.

2. No evidence of full thickness rotator cuff tear. 

3. Mild subcoracoid bursal fluid identified.  

4. Mild degenerative changes of the acromioclavicular joint.  

5. Mild degenerative changes of the anterior posterior superior labrum. 

6. Age of injury:  Chronic findings.

## 2022-01-11 IMAGING — MR MRI HIP LT W/O CONTRAST
7 series · 40 of 40 positions shown · IV contrast (Off)
Comparison: None.
COMPARISON: None.

------------- REPORT [id number] -------------
﻿MRI OF THE LEFT HIP WITHOUT CONTRAST
CLINICAL HISTORY: Fall.  Date of injury 08/22/2023.
TECHNIQUE: Multiplanar, multisequence MRI was performed on a [DATE] Tesla open MRI.
CLINICAL HISTORY: Fall.  Date of injury [DATE].

[Series 2: z t/s/c scano · axial · left · 10.0mm · 1.64mm/px · z∈[-21,+210]mm · 3 of 12 slices shown]
[im 1/12]
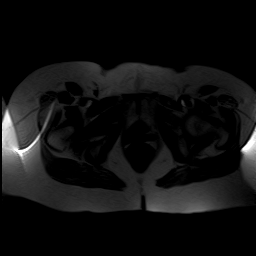
[im 6/12]
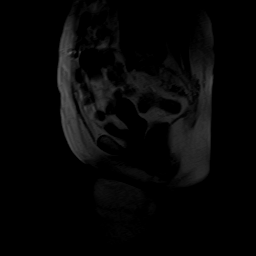
[im 12/12]
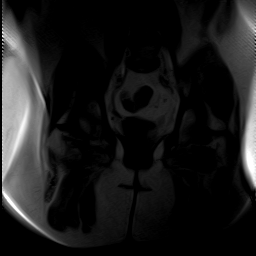

[Series 3: T1 · coronal · left · 4.0mm · 1.56mm/px · 6 of 18 slices shown (1 of 2)]
[im 1/18]
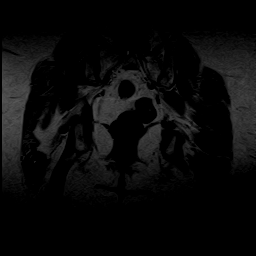
[im 4/18]
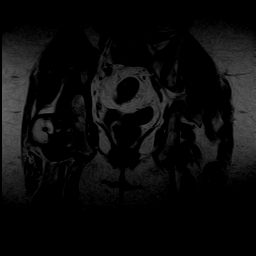
[im 7/18]
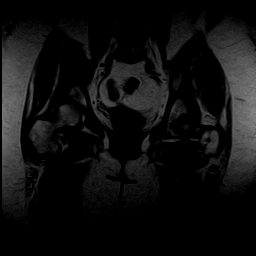
[im 11/18]
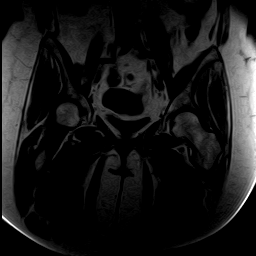
[im 14/18]
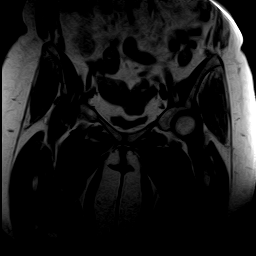
[im 18/18]
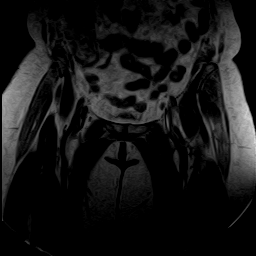

[Series 4: T1 · axial · left · 4.0mm · 1.09mm/px · z∈[-77,+28]mm · 7 of 22 slices shown (2 of 2)]
[im 1/22]
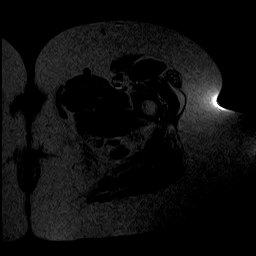
[im 4/22]
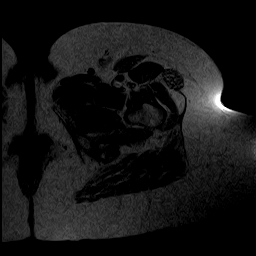
[im 8/22]
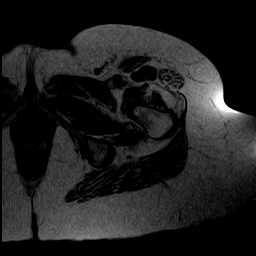
[im 11/22]
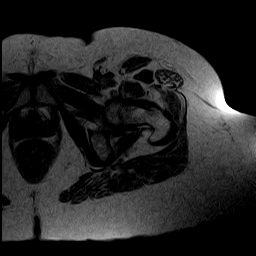
[im 15/22]
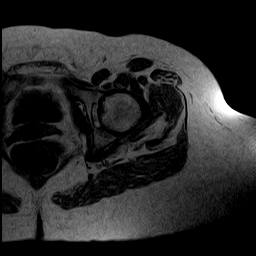
[im 18/22]
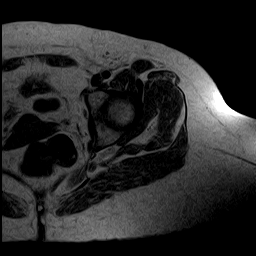
[im 22/22]
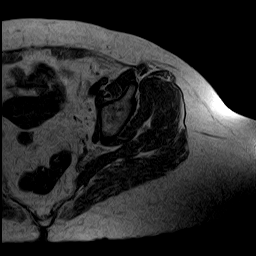

[Series 5: T2 · axial · left · 4.0mm · 1.09mm/px · z∈[-77,+28]mm · 7 of 22 slices shown (1 of 3)]
[im 1/22]
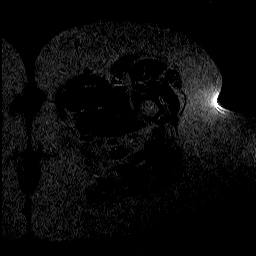
[im 4/22]
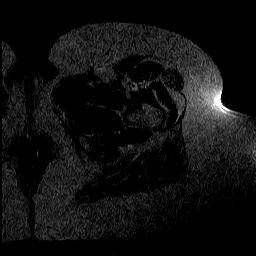
[im 8/22]
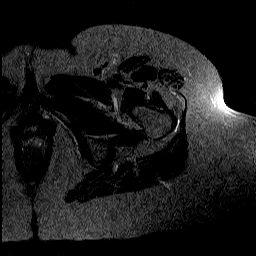
[im 11/22]
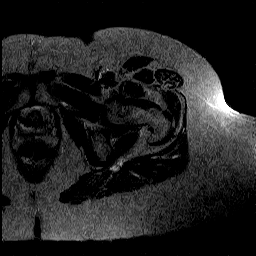
[im 15/22]
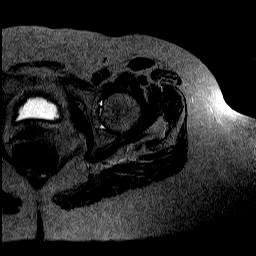
[im 18/22]
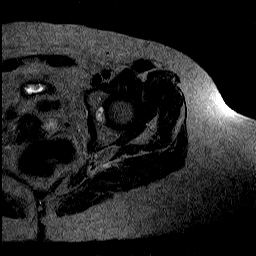
[im 22/22]
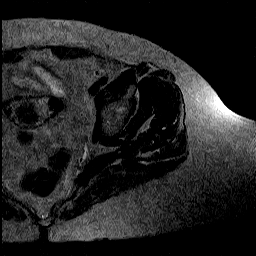

[Series 6: STIR · coronal · left · 5.0mm · 1.09mm/px · 6 of 18 slices shown]
[im 1/18]
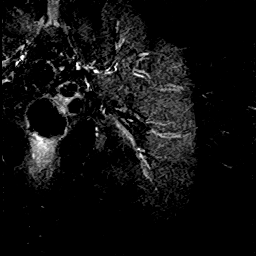
[im 4/18]
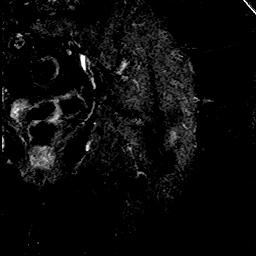
[im 7/18]
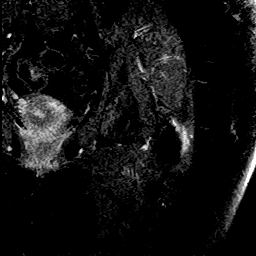
[im 11/18]
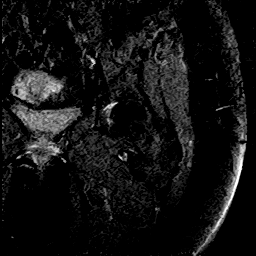
[im 14/18]
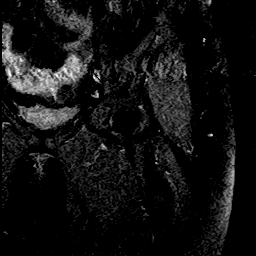
[im 18/18]
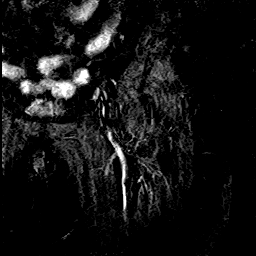

[Series 7: T2 · coronal · left · 4.0mm · 1.09mm/px · 6 of 18 slices shown (2 of 3)]
[im 1/18]
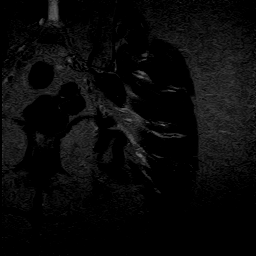
[im 4/18]
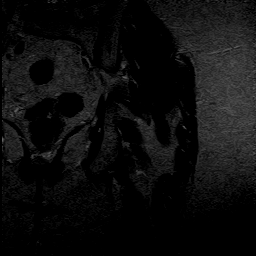
[im 7/18]
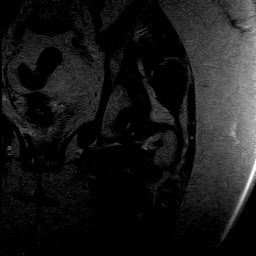
[im 11/18]
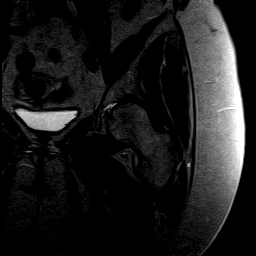
[im 14/18]
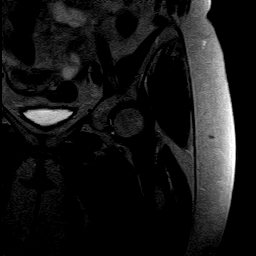
[im 18/18]
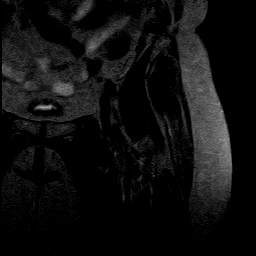

[Series 8: T2 · oblique · left · 4.0mm · 1.09mm/px · 5 of 16 slices shown (3 of 3)]
[im 1/16]
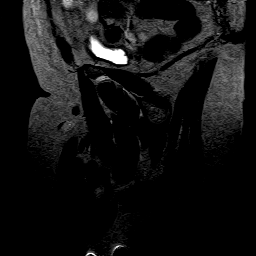
[im 4/16]
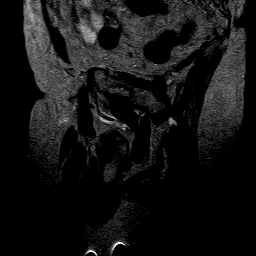
[im 8/16]
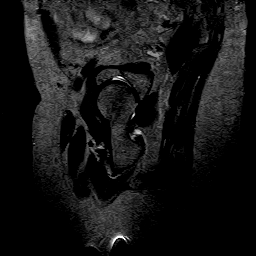
[im 12/16]
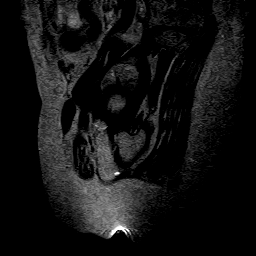
[im 16/16]
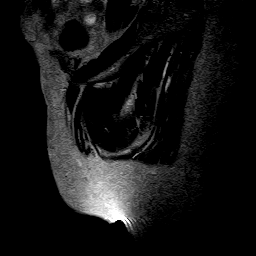

[40 of 40 positions shown; findings below may reference images not displayed]

FINDINGS: No evidence of left hip avascular necrosis.  Mild left hip trochanteric bursitis identified.  This may be symptomatic. 

The left common hamstring tendon is intact.  The visualized thigh muscles appear grossly unremarkable. 

The left hip labrum appears grossly unremarkable, however, degradation artifact limits detail. 

The urinary bladder appears normal.  

No evidence of free fluid in the pelvis.  

Right-sided uterine body leiomyoma identified measuring 1.2 cm.  

There is prominent size of the left ovary measuring 3.0 x 2.2 cm versus exophytic leiomyoma difficult to differentiate.  Suggest follow-up with a pelvic ultrasound or contrast MRI of the pelvis.
IMPRESSION: 1. Left hip mild trochanteric bursitis which may be from recent injury.  

2. Right-sided uterine body leiomyoma identified measuring 1.2 cm.  

3. There is prominent size of the left ovary measuring 3.0 x 2.2 cm versus exophytic leiomyoma difficult to differentiate.  Suggest follow-up with a pelvic ultrasound or contrast MRI of the pelvis.  

4. Age of injury:  Acute. 

<Amended to correct injury date.  09/08/2023  / JF >

------------- REPORT [id number] -------------
﻿MRI OF THE LEFT HIP WITHOUT CONTRAST
FINDINGS: No evidence of left hip avascular necrosis.  Mild left hip trochanteric bursitis identified.  This may be symptomatic. 

The left common hamstring tendon is intact.  The visualized thigh muscles appear grossly unremarkable. 

The left hip labrum appears grossly unremarkable, however, degradation artifact limits detail. 

The urinary bladder appears normal.  

No evidence of free fluid in the pelvis.  

Right-sided uterine body leiomyoma identified measuring 1.2 cm.  

There is prominent size of the left ovary measuring 3.0 x 2.2 cm versus exophytic leiomyoma difficult to differentiate.  Suggest follow-up with a pelvic ultrasound or contrast MRI of the pelvis.
IMPRESSION: 1. Left hip mild trochanteric bursitis which may be from recent injury.  

2. Right-sided uterine body leiomyoma identified measuring 1.2 cm.  

3. There is prominent size of the left ovary measuring 3.0 x 2.2 cm versus exophytic leiomyoma difficult to differentiate.  Suggest follow-up with a pelvic ultrasound or contrast MRI of the pelvis.  

4. Age of injury:  Acute.
